# Patient Record
Sex: Female | Born: 1975 | Race: White | Hispanic: No | Marital: Married | Smoking: Former smoker
Health system: Southern US, Community
[De-identification: ages and names within clinical notes are randomized; demographics above are authoritative.]

## PROBLEM LIST (undated history)

## (undated) DIAGNOSIS — F419 Anxiety disorder, unspecified: Secondary | ICD-10-CM

## (undated) DIAGNOSIS — R002 Palpitations: Secondary | ICD-10-CM

## (undated) DIAGNOSIS — G4730 Sleep apnea, unspecified: Secondary | ICD-10-CM

## (undated) DIAGNOSIS — F32A Depression, unspecified: Secondary | ICD-10-CM

## (undated) DIAGNOSIS — I10 Essential (primary) hypertension: Secondary | ICD-10-CM

## (undated) DIAGNOSIS — F329 Major depressive disorder, single episode, unspecified: Secondary | ICD-10-CM

## (undated) DIAGNOSIS — M1990 Unspecified osteoarthritis, unspecified site: Secondary | ICD-10-CM

## (undated) DIAGNOSIS — E785 Hyperlipidemia, unspecified: Secondary | ICD-10-CM

## (undated) DIAGNOSIS — J189 Pneumonia, unspecified organism: Secondary | ICD-10-CM

## (undated) DIAGNOSIS — R079 Chest pain, unspecified: Secondary | ICD-10-CM

## (undated) HISTORY — DX: Chest pain, unspecified: R07.9

## (undated) HISTORY — DX: Major depressive disorder, single episode, unspecified: F32.9

## (undated) HISTORY — PX: CHOLECYSTECTOMY: SHX55

## (undated) HISTORY — DX: Anxiety disorder, unspecified: F41.9

## (undated) HISTORY — DX: Hyperlipidemia, unspecified: E78.5

## (undated) HISTORY — DX: Depression, unspecified: F32.A

## (undated) HISTORY — PX: OTHER SURGICAL HISTORY: SHX169

## (undated) HISTORY — DX: Unspecified osteoarthritis, unspecified site: M19.90

## (undated) HISTORY — DX: Sleep apnea, unspecified: G47.30

---

## 1999-04-18 ENCOUNTER — Emergency Department (HOSPITAL_COMMUNITY): Admission: EM | Admit: 1999-04-18 | Discharge: 1999-04-18 | Payer: Self-pay | Admitting: Emergency Medicine

## 1999-08-12 ENCOUNTER — Emergency Department (HOSPITAL_COMMUNITY): Admission: EM | Admit: 1999-08-12 | Discharge: 1999-08-13 | Payer: Self-pay | Admitting: Emergency Medicine

## 2000-08-21 ENCOUNTER — Inpatient Hospital Stay (HOSPITAL_COMMUNITY): Admission: AD | Admit: 2000-08-21 | Discharge: 2000-08-21 | Payer: Self-pay | Admitting: Obstetrics

## 2002-10-29 ENCOUNTER — Emergency Department (HOSPITAL_COMMUNITY): Admission: EM | Admit: 2002-10-29 | Discharge: 2002-10-29 | Payer: Self-pay | Admitting: *Deleted

## 2006-06-28 ENCOUNTER — Ambulatory Visit (HOSPITAL_COMMUNITY): Admission: RE | Admit: 2006-06-28 | Discharge: 2006-06-28 | Payer: Self-pay | Admitting: Obstetrics and Gynecology

## 2011-03-07 ENCOUNTER — Emergency Department (HOSPITAL_COMMUNITY)
Admission: EM | Admit: 2011-03-07 | Discharge: 2011-03-07 | Disposition: A | Discharge status: Home / Self Care | Payer: No Typology Code available for payment source | Attending: General Surgery | Admitting: General Surgery

## 2011-03-07 DIAGNOSIS — R55 Syncope and collapse: Secondary | ICD-10-CM | POA: Insufficient documentation

## 2011-03-07 DIAGNOSIS — S20219A Contusion of unspecified front wall of thorax, initial encounter: Secondary | ICD-10-CM | POA: Insufficient documentation

## 2011-03-07 LAB — POCT I-STAT, CHEM 8
Calcium, Ion: 1.22 mmol/L (ref 1.12–1.32)
Creatinine, Ser: 0.5 mg/dL (ref 0.4–1.2)
Glucose, Bld: 109 mg/dL — ABNORMAL HIGH (ref 70–99)
HCT: 37 % (ref 36.0–46.0)
Hemoglobin: 12.6 g/dL (ref 12.0–15.0)
Potassium: 3.3 meq/L — ABNORMAL LOW (ref 3.5–5.1)
TCO2: 28 mmol/L (ref 0–100)

## 2013-02-20 ENCOUNTER — Emergency Department (HOSPITAL_COMMUNITY)
Admission: EM | Admit: 2013-02-20 | Discharge: 2013-02-20 | Disposition: A | Discharge status: Home / Self Care | Payer: BC Managed Care – PPO | Attending: Emergency Medicine | Admitting: Emergency Medicine

## 2013-02-20 ENCOUNTER — Emergency Department (HOSPITAL_COMMUNITY): Payer: BC Managed Care – PPO

## 2013-02-20 ENCOUNTER — Encounter (HOSPITAL_COMMUNITY): Payer: Self-pay | Admitting: *Deleted

## 2013-02-20 DIAGNOSIS — Z3202 Encounter for pregnancy test, result negative: Secondary | ICD-10-CM | POA: Insufficient documentation

## 2013-02-20 DIAGNOSIS — R112 Nausea with vomiting, unspecified: Secondary | ICD-10-CM | POA: Insufficient documentation

## 2013-02-20 DIAGNOSIS — I10 Essential (primary) hypertension: Secondary | ICD-10-CM | POA: Insufficient documentation

## 2013-02-20 DIAGNOSIS — N200 Calculus of kidney: Secondary | ICD-10-CM

## 2013-02-20 DIAGNOSIS — Z8679 Personal history of other diseases of the circulatory system: Secondary | ICD-10-CM | POA: Insufficient documentation

## 2013-02-20 HISTORY — DX: Essential (primary) hypertension: I10

## 2013-02-20 HISTORY — DX: Palpitations: R00.2

## 2013-02-20 LAB — URINALYSIS, ROUTINE W REFLEX MICROSCOPIC
Bilirubin Urine: NEGATIVE
Nitrite: NEGATIVE
Protein, ur: 30 mg/dL — AB
Specific Gravity, Urine: 1.029 (ref 1.005–1.030)
Urobilinogen, UA: 0.2 mg/dL (ref 0.0–1.0)

## 2013-02-20 LAB — BASIC METABOLIC PANEL
Calcium: 9.8 mg/dL (ref 8.4–10.5)
GFR calc Af Amer: >90 mL/min (ref >90)
GFR calc non Af Amer: >90 mL/min (ref >90)
Glucose, Bld: 100 mg/dL — ABNORMAL HIGH (ref 70–99)
Potassium: 3.6 mEq/L (ref 3.5–5.1)
Sodium: 139 mEq/L (ref 135–145)

## 2013-02-20 LAB — CBC WITH DIFFERENTIAL/PLATELET
Basophils Relative: 1 % (ref 0–1)
Eosinophils Absolute: 0.1 10*3/uL (ref 0.0–0.7)
Eosinophils Relative: 1 % (ref 0–5)
Lymphs Abs: 2.3 10*3/uL (ref 0.7–4.0)
MCH: 28.8 pg (ref 26.0–34.0)
MCHC: 35.1 g/dL (ref 30.0–36.0)
MCV: 81.9 fL (ref 78.0–100.0)
Neutrophils Relative %: 62 % (ref 43–77)
Platelets: 346 10*3/uL (ref 150–400)
RDW: 11.9 % (ref 11.5–15.5)

## 2013-02-20 LAB — URINE MICROSCOPIC-ADD ON

## 2013-02-20 LAB — POCT PREGNANCY, URINE: Preg Test, Ur: NEGATIVE

## 2013-02-20 MED ORDER — IBUPROFEN 800 MG PO TABS: 800.0000 mg | ORAL_TABLET | Freq: Three times a day (TID) | ORAL | Status: DC | PRN

## 2013-02-20 MED ORDER — OXYCODONE-ACETAMINOPHEN 5-325 MG PO TABS: 1.0000 | ORAL_TABLET | ORAL | Status: DC | PRN

## 2013-02-20 MED ORDER — KETOROLAC TROMETHAMINE 30 MG/ML IJ SOLN
30.0000 mg | Freq: Once | INTRAMUSCULAR | Status: AC
Start: 2013-02-20 — End: 2013-02-20
  Administered 2013-02-20: 30 mg via INTRAVENOUS
  Filled 2013-02-20: qty 1

## 2013-02-20 MED ORDER — HYDROMORPHONE HCL PF 1 MG/ML IJ SOLN
1.0000 mg | Freq: Once | INTRAMUSCULAR | Status: AC
  Administered 2013-02-20: 1 mg via INTRAVENOUS
  Filled 2013-02-20: qty 1

## 2013-02-20 MED ORDER — PROMETHAZINE HCL 25 MG/ML IJ SOLN
25.0000 mg | Freq: Once | INTRAMUSCULAR | Status: AC
  Administered 2013-02-20: 25 mg via INTRAVENOUS
  Filled 2013-02-20: qty 1

## 2013-02-20 MED ORDER — TAMSULOSIN HCL 0.4 MG PO CAPS
0.4000 mg | ORAL_CAPSULE | Freq: Once | ORAL | Status: AC
  Administered 2013-02-20: 0.4 mg via ORAL
  Filled 2013-02-20: qty 1

## 2013-02-20 MED ORDER — TAMSULOSIN HCL 0.4 MG PO CAPS: 0.4000 mg | ORAL_CAPSULE | Freq: Once | ORAL | Status: DC

## 2013-02-20 MED ORDER — MORPHINE SULFATE 4 MG/ML IJ SOLN
4.0000 mg | Freq: Once | INTRAMUSCULAR | Status: AC
  Administered 2013-02-20: 4 mg via INTRAVENOUS
  Filled 2013-02-20: qty 1

## 2013-02-20 MED ORDER — ONDANSETRON HCL 4 MG/2ML IJ SOLN
4.0000 mg | Freq: Once | INTRAMUSCULAR | Status: AC
  Administered 2013-02-20: 4 mg via INTRAVENOUS
  Filled 2013-02-20: qty 2

## 2013-02-20 MED ORDER — MORPHINE SULFATE 2 MG/ML IJ SOLN
1.0000 mg | Freq: Once | INTRAMUSCULAR | Status: AC
  Administered 2013-02-20: 1 mg via INTRAVENOUS
  Filled 2013-02-20: qty 1

## 2013-02-20 MED ORDER — ONDANSETRON HCL 4 MG PO TABS: 4.0000 mg | ORAL_TABLET | Freq: Three times a day (TID) | ORAL | Status: DC | PRN

## 2013-02-20 NOTE — ED Notes (Signed)
Pt from home with reports of left flank pain, nausea and emesis that started this morning at 0630.

## 2013-02-20 NOTE — ED Provider Notes (Signed)
I saw and evaluated the patient, reviewed the resident's note and I agree with the findings and plan.  Improvement in symptoms after pain control.  Left-sided ureteral stone.  Urology followup  Lyanne Co, MD 02/20/13 [phone]

## 2013-02-20 NOTE — ED Provider Notes (Signed)
History     CSN: [id number]  Arrival date & time 02/20/13  4696   None     Chief Complaint  Patient presents with  . Flank Pain    Left  . Nausea  . Emesis    (Consider location/radiation/quality/duration/timing/severity/associated sxs/prior treatment) HPI 37 yo F with HTN and palpitations who presents with left flank pain.  She states it has been present off and on for about 3 weeks.  This morning it got much worse, associated with nausea and vomiting.  She denies any fevers, dysuria or hematuria.  No history of kidney stones.  She has tried ibuprofen for the pain with no improvement.  She states holding her side helps a little.  Has also had some vaginal pain since this morning.  Denies any abdominal pain or diarrhea.  Eating and drinking well at home.  Past Medical History  Diagnosis Date  . Hypertension   . Heart palpitations     Past Surgical History  Procedure Laterality Date  . C-sections      3  . Cholecystectomy      History reviewed. No pertinent family history.  History  Substance Use Topics  . Smoking status: Former Games developer  . Smokeless tobacco: Never Used  . Alcohol Use: 0.6 oz/week    1 Glasses of wine per week     Comment: daily    OB History   Grav Para Term Preterm Abortions TAB SAB Ect Mult Living                  Review of Systems  Constitutional: Negative for fever and chills.  HENT: Negative.   Eyes: Negative.   Respiratory: Negative.   Cardiovascular: Negative.   Gastrointestinal: Positive for nausea and vomiting. Negative for abdominal pain, diarrhea, constipation and blood in stool.  Genitourinary: Positive for flank pain and vaginal pain. Negative for dysuria and hematuria.  Musculoskeletal: Negative.   Skin: Negative.   Neurological: Negative.   All other systems reviewed and are negative.    Allergies  Codeine  Home Medications  No current outpatient prescriptions on file.  Wt 240 lb (108.863 kg)  LMP  01/20/2013  Physical Exam  Constitutional: She is oriented to person, place, and time. She appears well-developed and well-nourished. She appears distressed (tearful and hunched over).  HENT:  Head: Normocephalic and atraumatic.  Right Ear: External ear normal.  Left Ear: External ear normal.  Mouth/Throat: Oropharynx is clear and moist.  Eyes: Conjunctivae are normal. Right eye exhibits no discharge. Left eye exhibits no discharge.  Neck: Normal range of motion. Neck supple.  Cardiovascular: Normal rate, regular rhythm, normal heart sounds and intact distal pulses.   No murmur heard. Pulmonary/Chest: Effort normal and breath sounds normal. No respiratory distress. She has no wheezes. She has no rales.  Abdominal: Soft. Bowel sounds are normal. She exhibits no distension. There is tenderness (along right side). There is no rebound, no guarding and no CVA tenderness.  Musculoskeletal: She exhibits no edema and no tenderness.  Lymphadenopathy:    She has no cervical adenopathy.  Neurological: She is alert and oriented to person, place, and time. She exhibits normal muscle tone. Coordination normal.  Skin: Skin is warm and dry. No rash noted. She is not diaphoretic. No erythema.  Psychiatric: She has a normal mood and affect. Thought content normal.    ED Course  Procedures (including critical care time)  Labs Reviewed  BASIC METABOLIC PANEL - Abnormal; Notable for the following:  Glucose, Bld 100 (*)    All other components within normal limits  URINALYSIS, ROUTINE W REFLEX MICROSCOPIC - Abnormal; Notable for the following:    Color, Urine AMBER (*)    APPearance CLOUDY (*)    Hgb urine dipstick LARGE (*)    Protein, ur 30 (*)    Leukocytes, UA TRACE (*)    All other components within normal limits  URINE MICROSCOPIC-ADD ON - Abnormal; Notable for the following:    Squamous Epithelial / LPF FEW (*)    Bacteria, UA MANY (*)    All other components within normal limits  CBC  WITH DIFFERENTIAL  POCT PREGNANCY, URINE   Ct Abdomen Pelvis Wo Contrast  02/20/2013  *RADIOLOGY REPORT*  Clinical Data: Left flank pain.  Nausea and vomiting.  CT ABDOMEN AND PELVIS WITHOUT CONTRAST  Technique:  Multidetector CT imaging of the abdomen and pelvis was performed following the standard protocol without intravenous contrast.  Comparison: None.  Findings: Mild dependent atelectasis is seen in the lung bases.  No pleural or pericardial effusion.  There is mild to moderate left hydronephrosis with stranding about the left kidney and ureter due to a 0.4 cm stone at the left UVJ. Also seen is a punctate nonobstructing stone in the upper pole of the left kidney.  The right kidney appears normal.  No urinary tract stones are seen on the right.  The patient is status post cholecystectomy.  The liver, spleen, adrenal glands and pancreas appear normal.  The stomach, small and large bowel and appendix appear normal.  Uterus and adnexa are unremarkable.  Small amount of free pelvic fluid is noted.  There is no lymphadenopathy.  No focal bony abnormality.  IMPRESSION:  1.  Moderate left hydronephrosis due to a 0.4 cm stone at the left UVJ.  Punctate nonobstructing stone upper pole left kidney also noted. 2.  Status post cholecystectomy.   Original Report Authenticated By: Holley Dexter, M.D.      No diagnosis found.    MDM  37 yo F with HTN presenting with intermittent left flank pain x3 weeks that became worse and constant this morning.  History is suspicious for kidney stone that is now causing obstruction.  Differential also includes UTI/pyelonephritis, ovarian pathology, diverticulitis.  However, these seem less likely given the location of her pain and lack of other symptoms.  Will given morphine IV for pain with zofran, check cbc, bmp, ua, upreg.   10:00AM: Labs are within normal limits.  Awaiting urine.  Pain improved some with 4 of morphine.  Will get CT scan to evaluate for obstructing  stone.  11:00AM: CT shows obstructing 4mm stone at the UV junction.  Will give toradol.  Discussed with patient that stone will likely pass on its own.  Will continue to work on pain control.  UA not particularly concerning for infection, will send for culture.  Plan on discharge with follow up with urology.  12:20PM: Discussed diagnosis and treatment with husband.  Patient is sleeping comfortably.  Home with percocet 1-2 tables q4hrs, zofran q8hr, flomax daily.  Follow up with urology next week.  Return precautions reviewed including worsening pain, unable to tolerate POs, and fever.  Susan Colla, MD 02/20/13 [phone]

## 2013-02-20 NOTE — ED Notes (Signed)
Resident A. Elwyn Reach notified pt is asking for more pain medication.

## 2013-09-02 ENCOUNTER — Emergency Department (HOSPITAL_BASED_OUTPATIENT_CLINIC_OR_DEPARTMENT_OTHER)
Admission: EM | Admit: 2013-09-02 | Discharge: 2013-09-02 | Disposition: A | Discharge status: Home / Self Care | Payer: BC Managed Care – PPO | Attending: Emergency Medicine | Admitting: Emergency Medicine

## 2013-09-02 ENCOUNTER — Encounter (HOSPITAL_BASED_OUTPATIENT_CLINIC_OR_DEPARTMENT_OTHER): Payer: Self-pay | Admitting: Emergency Medicine

## 2013-09-02 ENCOUNTER — Emergency Department (HOSPITAL_BASED_OUTPATIENT_CLINIC_OR_DEPARTMENT_OTHER): Payer: BC Managed Care – PPO

## 2013-09-02 DIAGNOSIS — F17200 Nicotine dependence, unspecified, uncomplicated: Secondary | ICD-10-CM | POA: Insufficient documentation

## 2013-09-02 DIAGNOSIS — S8990XA Unspecified injury of unspecified lower leg, initial encounter: Secondary | ICD-10-CM | POA: Insufficient documentation

## 2013-09-02 DIAGNOSIS — M25561 Pain in right knee: Secondary | ICD-10-CM

## 2013-09-02 DIAGNOSIS — I10 Essential (primary) hypertension: Secondary | ICD-10-CM | POA: Insufficient documentation

## 2013-09-02 DIAGNOSIS — X500XXA Overexertion from strenuous movement or load, initial encounter: Secondary | ICD-10-CM | POA: Insufficient documentation

## 2013-09-02 DIAGNOSIS — Y9389 Activity, other specified: Secondary | ICD-10-CM | POA: Insufficient documentation

## 2013-09-02 DIAGNOSIS — W07XXXA Fall from chair, initial encounter: Secondary | ICD-10-CM | POA: Insufficient documentation

## 2013-09-02 DIAGNOSIS — Y929 Unspecified place or not applicable: Secondary | ICD-10-CM | POA: Insufficient documentation

## 2013-09-02 MED ORDER — HYDROCODONE-ACETAMINOPHEN 5-325 MG PO TABS: 1.0000 | ORAL_TABLET | Freq: Three times a day (TID) | ORAL | Status: DC | PRN

## 2013-09-02 NOTE — ED Notes (Signed)
MD at bedside. 

## 2013-09-02 NOTE — ED Provider Notes (Signed)
CSN: [id number]     Arrival date & time 09/02/13  1805 History   First MD Initiated Contact with Patient 09/02/13 1811     This chart was scribed for Gerhard Munch, MD by Arlan Organ, ED Scribe. This patient was seen in room MH02/MH02 and the patient's care was started 6:21 PM.   Chief Complaint  Patient presents with  . Knee Injury   The history is provided by the patient. No language interpreter was used.   HPI Comments: Susan Barrett is a 37 y.o. female who presents to the Emergency Department complaining of sudden onset, gradually worsening, constant right knee pain that started 3 days ago. She stated she was hanging up some decorations when she fell off of a chair, and twisted her knee. She states she heard a "crack" right after impact. She says she has been taking ibuprofen for pain with mild relief. Pt denies fever, chills, or dyspnea. Pt states she occasionally smokes. She states she is otherwise healthy.  Past Medical History  Diagnosis Date  . Hypertension   . Heart palpitations    Past Surgical History  Procedure Laterality Date  . C-sections      3  . Cholecystectomy     History reviewed. No pertinent family history. History  Substance Use Topics  . Smoking status: Current Some Day Smoker  . Smokeless tobacco: Never Used  . Alcohol Use: 0.6 oz/week    1 Glasses of wine per week     Comment: daily   OB History   Grav Para Term Preterm Abortions TAB SAB Ect Mult Living                 Review of Systems  Constitutional:       Per HPI, otherwise negative  HENT:       Per HPI, otherwise negative  Respiratory:       Per HPI, otherwise negative  Cardiovascular:       Per HPI, otherwise negative  Gastrointestinal: Negative for vomiting.  Endocrine:       Negative aside from HPI  Genitourinary:       Neg aside from HPI   Musculoskeletal:       Per HPI, otherwise negative  Skin: Negative.   Neurological: Negative for syncope.    Allergies   Codeine  Home Medications   Current Outpatient Rx  Name  Route  Sig  Dispense  Refill  . ibuprofen (ADVIL,MOTRIN) 800 MG tablet   Oral   Take 1 tablet (800 mg total) by mouth every 8 (eight) hours as needed for pain.   30 tablet   0   . ondansetron (ZOFRAN) 4 MG tablet   Oral   Take 1 tablet (4 mg total) by mouth every 8 (eight) hours as needed for nausea.   20 tablet   0   . oxyCODONE-acetaminophen (ROXICET) 5-325 MG per tablet   Oral   Take 1-2 tablets by mouth every 4 (four) hours as needed for pain.   30 tablet   0   . tamsulosin (FLOMAX) 0.4 MG CAPS   Oral   Take 1 capsule (0.4 mg total) by mouth once.   10 capsule   0    BP 121/81  Pulse 62  Temp(Src) 98.2 F (36.8 C)  Resp 16  Ht 5\' 8"  (1.727 m)  Wt 240 lb (108.863 kg)  BMI 36.50 kg/m2  SpO2 99%  LMP 09/02/2013  Physical Exam  Nursing note and vitals reviewed.  Constitutional: She is oriented to person, place, and time. She appears well-developed and well-nourished. No distress.  HENT:  Head: Normocephalic and atraumatic.  Eyes: Conjunctivae and EOM are normal.  Cardiovascular: Normal rate and regular rhythm.   Pulmonary/Chest: Effort normal and breath sounds normal. No stridor. No respiratory distress.  Abdominal: She exhibits no distension.  Musculoskeletal: She exhibits no edema.  Diffused tenderness throughout the knee No patella tenderness Thigh pain Knee is stable Medial quad pain   Neurological: She is alert and oriented to person, place, and time. No cranial nerve deficit.  Skin: Skin is warm and dry.  Psychiatric: She has a normal mood and affect.    ED Course  Procedures (including critical care time)  DIAGNOSTIC STUDIES: Oxygen Saturation is 99% on RA, Normal by my interpretation.    COORDINATION OF CARE: 6:21 PM- Will order X-Rays. Discussed treatment plan with pt at bedside and pt agreed to plan.      Labs Review Labs Reviewed - No data to display Imaging Review No  results found.  EKG Interpretation   None       MDM  No diagnosis found. I personally performed the services described in this documentation, which was scribed in my presence. The recorded information has been reviewed and is accurate.  Patient presents several hours after minor trauma with ongoing pain in the right knee.  She is neurovascularly intact, with preserved capacity to flex and extend the knee.  She can bear weight.  There is no radiographic evidence of fracture.  The patient was discharged in stable condition with Ace wrap, crutches, orthopedics followup   Gerhard Munch, MD 09/02/13 1931

## 2013-09-02 NOTE — ED Notes (Signed)
Pt d/c home with rx x 1 for hydrocodone. Ice pack and crutches given for home use

## 2013-09-02 NOTE — ED Notes (Signed)
Pt c/o right knee injury x 3 days ago  

## 2014-12-30 ENCOUNTER — Emergency Department (HOSPITAL_COMMUNITY)
Admission: EM | Admit: 2014-12-30 | Discharge: 2014-12-30 | Disposition: A | Discharge status: Home / Self Care | Payer: BLUE CROSS/BLUE SHIELD | Source: Home / Self Care

## 2014-12-30 ENCOUNTER — Encounter (HOSPITAL_COMMUNITY): Payer: Self-pay | Admitting: *Deleted

## 2014-12-30 DIAGNOSIS — S61219A Laceration without foreign body of unspecified finger without damage to nail, initial encounter: Secondary | ICD-10-CM

## 2014-12-30 MED ORDER — POVIDONE-IODINE 10 % EX SOLN
CUTANEOUS | Status: AC
  Filled 2014-12-30: qty 118

## 2014-12-30 NOTE — ED Notes (Addendum)
Laceration      To       l middle  Finger          This am  When  She  Snagged  It  On   Some  Wood         - bleeding  Has  Subsided        Incident  Happened  This  Am   Tet  Shot is  utd

## 2014-12-30 NOTE — ED Provider Notes (Signed)
CSN: [id number]     Arrival date & time 12/30/14  0809 History   None    Chief Complaint  Patient presents with  . Laceration   (Consider location/radiation/quality/duration/timing/severity/associated sxs/prior Treatment) Patient is a 39 y.o. female presenting with skin laceration. The history is provided by the patient.  Laceration Location:  Finger Finger laceration location:  L middle finger Depth:  Cutaneous Quality: straight   Bleeding: controlled   Laceration mechanism:  Metal edge Pain details:    Progression:  Unchanged Foreign body present:  No foreign bodies Ineffective treatments:  None tried Tetanus status:  Up to date   Past Medical History  Diagnosis Date  . Hypertension   . Heart palpitations    Past Surgical History  Procedure Laterality Date  . C-sections      3  . Cholecystectomy     History reviewed. No pertinent family history. History  Substance Use Topics  . Smoking status: Current Some Day Smoker  . Smokeless tobacco: Never Used  . Alcohol Use: 0.6 oz/week    1 Glasses of wine per week     Comment: daily   OB History    No data available     Review of Systems  Constitutional: Negative.   Skin: Positive for wound.    Allergies  Codeine  Home Medications   Prior to Admission medications   Medication Sig Start Date End Date Taking? Authorizing Provider  Sertraline HCl (ZOLOFT PO) Take by mouth.   Yes Historical Provider, MD  HYDROcodone-acetaminophen (NORCO/VICODIN) 5-325 MG per tablet Take 1 tablet by mouth every 8 (eight) hours as needed for pain. 09/02/13   Gerhard Munch, MD  ibuprofen (ADVIL,MOTRIN) 800 MG tablet Take 1 tablet (800 mg total) by mouth every 8 (eight) hours as needed for pain. 02/20/13   Charm Rings, MD  ondansetron (ZOFRAN) 4 MG tablet Take 1 tablet (4 mg total) by mouth every 8 (eight) hours as needed for nausea. 02/20/13   Charm Rings, MD  oxyCODONE-acetaminophen (ROXICET) 5-325 MG per tablet Take 1-2 tablets by  mouth every 4 (four) hours as needed for pain. 02/20/13   Charm Rings, MD  tamsulosin (FLOMAX) 0.4 MG CAPS Take 1 capsule (0.4 mg total) by mouth once. 02/20/13   Charm Rings, MD   BP 120/80 mmHg  Pulse 72  Temp(Src) 98.6 F (37 C) (Oral)  Resp 18  SpO2 100%  LMP 12/02/2014 Physical Exam  Constitutional: She is oriented to person, place, and time. She appears well-developed and well-nourished.  Musculoskeletal: She exhibits tenderness.  Neurological: She is alert and oriented to person, place, and time.  Skin: Skin is warm and dry.  Lac to volar pip jt of lmf, nvt intact.  Nursing note and vitals reviewed.   ED Course  LACERATION REPAIR Date/Time: 12/30/2014 9:14 AM Performed by: Linna Hoff Authorized by: Bradd Canary D Consent: Verbal consent obtained. Consent given by: patient Body area: upper extremity Location details: left long finger Laceration length: 2 cm Foreign bodies: no foreign bodies Tendon involvement: none Nerve involvement: none Vascular damage: no Anesthesia: local infiltration Local anesthetic: lidocaine 2% without epinephrine Patient sedated: no Preparation: Patient was prepped and draped in the usual sterile fashion. Irrigation solution: saline Irrigation method: syringe Amount of cleaning: standard Debridement: none Degree of undermining: none Skin closure: 5-0 Prolene Number of sutures: 4 Technique: simple Approximation: close Approximation difficulty: simple Dressing: antibiotic ointment and gauze roll Patient tolerance: Patient tolerated the procedure well with no  immediate complications   (including critical care time) Labs Review Labs Reviewed - No data to display  Imaging Review No results found.   MDM   1. Finger laceration, initial encounter        Linna HoffJames D Gizzelle Lacomb, MD 12/30/14 [phone]

## 2017-03-03 ENCOUNTER — Encounter: Payer: Self-pay | Admitting: Gastroenterology

## 2017-04-11 ENCOUNTER — Other Ambulatory Visit (INDEPENDENT_AMBULATORY_CARE_PROVIDER_SITE_OTHER): Payer: BLUE CROSS/BLUE SHIELD

## 2017-04-11 ENCOUNTER — Encounter (INDEPENDENT_AMBULATORY_CARE_PROVIDER_SITE_OTHER): Payer: Self-pay

## 2017-04-11 ENCOUNTER — Ambulatory Visit (INDEPENDENT_AMBULATORY_CARE_PROVIDER_SITE_OTHER): Payer: BLUE CROSS/BLUE SHIELD | Admitting: Gastroenterology

## 2017-04-11 ENCOUNTER — Encounter: Payer: Self-pay | Admitting: Gastroenterology

## 2017-04-11 VITALS — BP 100/70 | HR 60 | Ht 68.0 in | Wt 242.0 lb

## 2017-04-11 DIAGNOSIS — Z800 Family history of malignant neoplasm of digestive organs: Secondary | ICD-10-CM

## 2017-04-11 DIAGNOSIS — R635 Abnormal weight gain: Secondary | ICD-10-CM

## 2017-04-11 DIAGNOSIS — R5383 Other fatigue: Secondary | ICD-10-CM | POA: Diagnosis not present

## 2017-04-11 LAB — CBC WITH DIFFERENTIAL/PLATELET
Basophils Absolute: 0.1 10*3/uL (ref 0.0–0.1)
Basophils Relative: 1 % (ref 0.0–3.0)
EOS PCT: 1.9 % (ref 0.0–5.0)
Eosinophils Absolute: 0.1 10*3/uL (ref 0.0–0.7)
HCT: 40 % (ref 36.0–46.0)
Hemoglobin: 13.8 g/dL (ref 12.0–15.0)
Lymphocytes Relative: 36.9 % (ref 12.0–46.0)
Lymphs Abs: 3 10*3/uL (ref 0.7–4.0)
MCHC: 34.4 g/dL (ref 30.0–36.0)
MCV: 84.2 fl (ref 78.0–100.0)
Monocytes Absolute: 0.7 10*3/uL (ref 0.1–1.0)
Monocytes Relative: 8.8 % (ref 3.0–12.0)
NEUTROS PCT: 51.4 % (ref 43.0–77.0)
Neutro Abs: 4.2 10*3/uL (ref 1.4–7.7)
Platelets: 356 10*3/uL (ref 150.0–400.0)
RBC: 4.75 Mil/uL (ref 3.87–5.11)
RDW: 12.7 % (ref 11.5–15.5)
WBC: 8.1 10*3/uL (ref 4.0–10.5)

## 2017-04-11 LAB — BASIC METABOLIC PANEL
BUN: 12 mg/dL (ref 6–23)
CALCIUM: 9.6 mg/dL (ref 8.4–10.5)
CO2: 32 meq/L (ref 19–32)
Chloride: 102 mEq/L (ref 96–112)
Creatinine, Ser: 0.57 mg/dL (ref 0.40–1.20)
GFR: 124.07 mL/min (ref >60.00)
Glucose, Bld: 91 mg/dL (ref 70–99)
Potassium: 4.3 mEq/L (ref 3.5–5.1)
SODIUM: 138 meq/L (ref 135–145)

## 2017-04-11 LAB — TSH: TSH: 0.93 u[IU]/mL (ref 0.35–4.50)

## 2017-04-11 MED ORDER — NA SULFATE-K SULFATE-MG SULF 17.5-3.13-1.6 GM/177ML PO SOLN: 1.0000 | Freq: Once | ORAL | 0 refills | Status: AC

## 2017-04-11 NOTE — Progress Notes (Signed)
PIYA MESCH    [id number]    08/16/1976  Primary Care Physician:Patient, No Pcp Per  Referring Physician: No referring provider defined for this encounter.  Chief complaint:  Family history of colon cancer and multiple colon polyps   HPI:  41 year old female here to discuss colorectal cancer screening. She has family history of colon cancer on her paternal side, both her paternal aunt and uncle diagnosed with colon cancer in their early 107s. Her father had around 65 polyps removed on his first screening colonoscopy, she thinks one of the polyps was advanced with foci of cancer. He has been getting frequent colonoscopies since then. Her sister had 8 polyps removed on her colonoscopy done at age 35. Her brother had colonoscopy at age 77 and had 16 polyps removed. Her other brother colonoscopy at age 103 with 6 polyps that were removed. Records are not available during this visit to review. Patient denies any abdominal pain, nausea, vomiting, change in bowel habits or blood per rectum. She complains of excessive weight gain, she has gained greater than 60 pounds in the past 1 year. Also complains of fatigue and increased lethargy.   Outpatient Encounter Prescriptions as of 04/11/2017  Medication Sig  . ibuprofen (ADVIL,MOTRIN) 800 MG tablet Take 1 tablet (800 mg total) by mouth every 8 (eight) hours as needed for pain.  . Sertraline HCl (ZOLOFT PO) Take by mouth.  . [DISCONTINUED] HYDROcodone-acetaminophen (NORCO/VICODIN) 5-325 MG per tablet Take 1 tablet by mouth every 8 (eight) hours as needed for pain.  . [DISCONTINUED] ondansetron (ZOFRAN) 4 MG tablet Take 1 tablet (4 mg total) by mouth every 8 (eight) hours as needed for nausea.  . [DISCONTINUED] oxyCODONE-acetaminophen (ROXICET) 5-325 MG per tablet Take 1-2 tablets by mouth every 4 (four) hours as needed for pain.  . [DISCONTINUED] tamsulosin (FLOMAX) 0.4 MG CAPS Take 1 capsule (0.4 mg total) by mouth once.   No  facility-administered encounter medications on file as of 04/11/2017.     Allergies as of 04/11/2017 - Review Complete 04/11/2017  Allergen Reaction Noted  . Codeine Hives and Itching 02/20/2013  . Hydrocodone  10/11/2011    Past Medical History:  Diagnosis Date  . Heart palpitations   . Hypertension     Past Surgical History:  Procedure Laterality Date  . C-sections     3  . CHOLECYSTECTOMY      No family history on file.  Social History   Social History  . Marital status: Single    Spouse name: N/A  . Number of children: 3  . Years of education: N/A   Occupational History  . Housekeeping    Social History Main Topics  . Smoking status: Former Games developer  . Smokeless tobacco: Never Used  . Alcohol use 0.6 oz/week    1 Glasses of wine per week     Comment: daily  . Drug use: No  . Sexual activity: Not on file   Other Topics Concern  . Not on file   Social History Narrative  . No narrative on file      Review of systems: Review of Systems  Constitutional: Negative for fever and chills.  positive for fatigue HENT: Negative.   Eyes: Negative for blurred vision.  Respiratory: Negative for cough, shortness of breath and wheezing.   Cardiovascular: Negative for chest pain and palpitations.  Gastrointestinal: as per HPI Genitourinary: Negative for dysuria, urgency, frequency and hematuria.  Musculoskeletal: Positive for  myalgias, back pain and joint pain.  Skin: Negative for itching and rash.  Neurological: Negative for dizziness, tremors, focal weakness, seizures and loss of consciousness.  positive for headaches Endo/Heme/Allergies: Positive for seasonal allergies.  Psychiatric/Behavioral: Negative for  suicidal ideas and hallucinations.  positive for depression and anxiety All other systems reviewed and are negative.   Physical Exam: Vitals:   04/11/17 1054  BP: 100/70  Pulse: 60   Body mass index is 36.8 kg/m. Gen:      No acute distress HEENT:   EOMI, sclera anicteric Neck:     No masses; no thyromegaly Lungs:    Clear to auscultation bilaterally; normal respiratory effort CV:         Regular rate and rhythm; no murmurs Abd:      + bowel sounds; soft, non-tender; no palpable masses, no distension Ext:    No edema; adequate peripheral perfusion Skin:      Warm and dry; no rash Neuro: alert and oriented x 3 Psych: normal mood and affect  Data Reviewed:  Reviewed labs, radiology imaging, old records and pertinent past GI work up   Assessment and Plan/Recommendations:  41 year old female with history of colon cancer in multiple second-degree relatives (Paternal side) and history of multiple colon polyps in her first-degree relatives (father and siblings) here to discuss colorectal cancer screening  We'll schedule for colonoscopy for colorectal cancer screening given her family history The risks and benefits as well as alternatives of endoscopic procedure(s) have been discussed and reviewed. All questions answered. The patient agrees to proceed.  I'll also refer to genetic counseling to evaluate for possible polyposis syndrome or Lynch syndrome  Excessive weight gain associated with fatigue and lethargy Check TSH, CBC, BMP Advise patient to follow up with PMD  Return after colonoscopy as needed  K. Scherry RanVeena Nandigam , MD [phone] Mon-Fri 8a-5p [phone] after 5p, weekends, holidays  CC: No ref. provider found

## 2017-04-11 NOTE — Patient Instructions (Addendum)
Go to the basement for labs today  You have been scheduled for a colonoscopy. Please follow written instructions given to you at your visit today.  Please pick up your prep supplies at the pharmacy within the next 1-3 days. If you use inhalers (even only as needed), please bring them with you on the day of your procedure.   We will refer you to Genetics counseling, they will contact you with that appointment   Follow up as needed

## 2017-04-17 ENCOUNTER — Encounter: Payer: Self-pay | Admitting: Gastroenterology

## 2017-05-01 ENCOUNTER — Encounter: Payer: BLUE CROSS/BLUE SHIELD | Admitting: Gastroenterology

## 2018-06-30 ENCOUNTER — Encounter (HOSPITAL_BASED_OUTPATIENT_CLINIC_OR_DEPARTMENT_OTHER): Payer: Self-pay | Admitting: Emergency Medicine

## 2018-06-30 ENCOUNTER — Other Ambulatory Visit: Payer: Self-pay

## 2018-06-30 ENCOUNTER — Emergency Department (HOSPITAL_BASED_OUTPATIENT_CLINIC_OR_DEPARTMENT_OTHER)
Admission: EM | Admit: 2018-06-30 | Discharge: 2018-07-01 | Disposition: A | Discharge status: Home / Self Care | Payer: BLUE CROSS/BLUE SHIELD | Attending: Emergency Medicine | Admitting: Emergency Medicine

## 2018-06-30 DIAGNOSIS — J029 Acute pharyngitis, unspecified: Secondary | ICD-10-CM | POA: Diagnosis not present

## 2018-06-30 DIAGNOSIS — Z87891 Personal history of nicotine dependence: Secondary | ICD-10-CM | POA: Diagnosis not present

## 2018-06-30 DIAGNOSIS — G4489 Other headache syndrome: Secondary | ICD-10-CM

## 2018-06-30 DIAGNOSIS — E785 Hyperlipidemia, unspecified: Secondary | ICD-10-CM | POA: Insufficient documentation

## 2018-06-30 DIAGNOSIS — Z79899 Other long term (current) drug therapy: Secondary | ICD-10-CM | POA: Insufficient documentation

## 2018-06-30 DIAGNOSIS — B349 Viral infection, unspecified: Secondary | ICD-10-CM

## 2018-06-30 DIAGNOSIS — R059 Cough, unspecified: Secondary | ICD-10-CM

## 2018-06-30 DIAGNOSIS — R05 Cough: Secondary | ICD-10-CM | POA: Diagnosis not present

## 2018-06-30 DIAGNOSIS — I10 Essential (primary) hypertension: Secondary | ICD-10-CM | POA: Diagnosis not present

## 2018-06-30 DIAGNOSIS — R51 Headache: Secondary | ICD-10-CM | POA: Diagnosis present

## 2018-06-30 NOTE — ED Triage Notes (Signed)
PT presents with c/o headache, runny nose and scratchy throat for 3 days

## 2018-07-01 LAB — GROUP A STREP BY PCR: GROUP A STREP BY PCR: NOT DETECTED

## 2018-07-01 MED ORDER — METOCLOPRAMIDE HCL 5 MG/ML IJ SOLN
10.0000 mg | Freq: Once | INTRAMUSCULAR | Status: AC
  Administered 2018-07-01: 10 mg via INTRAVENOUS
  Filled 2018-07-01: qty 2

## 2018-07-01 MED ORDER — KETOROLAC TROMETHAMINE 30 MG/ML IJ SOLN
30.0000 mg | Freq: Once | INTRAMUSCULAR | Status: AC
  Administered 2018-07-01: 30 mg via INTRAVENOUS
  Filled 2018-07-01: qty 1

## 2018-07-01 MED ORDER — DIPHENHYDRAMINE HCL 50 MG/ML IJ SOLN
25.0000 mg | Freq: Once | INTRAMUSCULAR | Status: AC
  Administered 2018-07-01: 25 mg via INTRAVENOUS
  Filled 2018-07-01: qty 1

## 2018-07-01 MED ORDER — SODIUM CHLORIDE 0.9 % IV BOLUS (SEPSIS)
1000.0000 mL | Freq: Once | INTRAVENOUS | Status: AC
  Administered 2018-07-01: 1000 mL via INTRAVENOUS

## 2018-07-01 NOTE — ED Notes (Signed)
Offered pt a wheelchair at d/c, she declined.

## 2018-07-01 NOTE — ED Provider Notes (Signed)
[facility] EMERGENCY DEPARTMENT Provider Note   CSN: [id number] Arrival date & time: 06/30/18  2322    History   Chief Complaint Chief Complaint  Patient presents with  . Headache    HPI Susan Barrett is a 42 y.o. female.  The history is provided by the patient.  Headache   This is a new problem. The current episode started more than 2 days ago. The problem occurs constantly. The problem has been gradually worsening. The pain is moderate. Pertinent negatives include no fever, no shortness of breath and no vomiting. She has tried nothing for the symptoms.  PT With history of anxiety presents with sore throat, cough, headache. No fever.  No vomiting.  She reports rhinorrhea and congestion.  No chest pain or shortness of breath. She has had sick contacts.  No recent travel.  She is a non-smoker. Past Medical History:  Diagnosis Date  . Anxiety and depression   . Arthritis   . Heart palpitations   . Hyperlipemia   . Hypertension   . Sleep apnea     There are no active problems to display for this patient.   Past Surgical History:  Procedure Laterality Date  . C-sections     3  . CHOLECYSTECTOMY       OB History   None      Home Medications    Prior to Admission medications   Medication Sig Start Date End Date Taking? Authorizing Provider  Sertraline HCl (ZOLOFT PO) Take by mouth.   Yes [provider]  ibuprofen (ADVIL,MOTRIN) 800 MG tablet Take 1 tablet (800 mg total) by mouth every 8 (eight) hours as needed for pain. 02/20/13   Charm Rings, MD    Family History Family History  Problem Relation Age of Onset  . Colon polyps Father   . Crohn's disease Father   . Heart disease Father   . Colon cancer Paternal Aunt   . Colon cancer Paternal Uncle   . Diabetes Son     Social History Social History   Tobacco Use  . Smoking status: Former Games developer  . Smokeless tobacco: Never Used  Substance Use Topics  . Alcohol use: Yes   Alcohol/week: 1.0 standard drinks    Types: 1 Glasses of wine per week    Comment: daily  . Drug use: No     Allergies   Codeine and Hydrocodone   Review of Systems Review of Systems  Constitutional: Negative for fever.  HENT: Positive for rhinorrhea and sore throat.   Respiratory: Negative for shortness of breath.   Cardiovascular: Negative for chest pain.  Gastrointestinal: Negative for vomiting.  Skin: Negative for rash.  Neurological: Positive for headaches. Negative for weakness.  All other systems reviewed and are negative.    Physical Exam Updated Vital Signs BP 111/65   Pulse 64   Temp 98.1 F (36.7 C) (Oral)   Resp (!) 21   SpO2 95%   Physical Exam CONSTITUTIONAL: Well developed/well nourished, anxious and moaning HEAD: Normocephalic/atraumatic EYES: EOMI/PERRL ENMT: Mucous membranes moist, uvula midline, erythema noted, no exudates, no stridor or drooling. NECK: supple no meningeal signs, left cervical lymphadenopathy SPINE/BACK:entire spine nontender CV: S1/S2 noted, no murmurs/rubs/gallops noted LUNGS: Lungs are clear to auscultation bilaterally, no apparent distress ABDOMEN: soft, nontender NEURO: Pt is awake/alert/appropriate, moves all extremitiesx4.  No facial droop.  No arm or leg drift.  Mental status appropriate EXTREMITIES: pulses normal/equal, full ROM SKIN: warm, color normal, no rash or  petechiae PSYCH: Anxious   ED Treatments / Results  Labs (all labs ordered are listed, but only abnormal results are displayed) Labs Reviewed  GROUP A STREP BY PCR    EKG None  Radiology No results found.  Procedures Procedures  Medications Ordered in ED Medications  metoCLOPramide (REGLAN) injection 10 mg (10 mg Intravenous Given 07/01/18 0214)  diphenhydrAMINE (BENADRYL) injection 25 mg (25 mg Intravenous Given 07/01/18 0213)  ketorolac (TORADOL) 30 MG/ML injection 30 mg (30 mg Intravenous Given 07/01/18 0214)  sodium chloride 0.9 % bolus 1,000 mL  (0 mLs Intravenous Stopped 07/01/18 0336)     Initial Impression / Assessment and Plan / ED Course  I have reviewed the triage vital signs and the nursing notes.  Pertinent labs  results that were available during my care of the patient were reviewed by me and considered in my medical decision making (see chart for details).     Presented with headache, sore throat, myalgias.  Suspect viral illness.  Strep test negative.  She had no focal neuro deficits. She was in no distress and appears improved. Low suspicion for meningitis Lung Sounds clear, doubt pneumonia. Discussed symptomatic treatment.  She is appropriate for discharge home  Final Clinical Impressions(s) / ED Diagnoses   Final diagnoses:  Other headache syndrome  Pharyngitis, unspecified etiology  Cough    ED Discharge Orders    None       Zadie Rhine, MD 07/01/18 [phone]

## 2018-07-01 NOTE — ED Notes (Signed)
MD with pt  

## 2019-01-08 ENCOUNTER — Other Ambulatory Visit: Payer: Self-pay

## 2019-01-08 ENCOUNTER — Encounter (HOSPITAL_COMMUNITY): Payer: Self-pay

## 2019-01-08 ENCOUNTER — Emergency Department (HOSPITAL_COMMUNITY): Payer: BLUE CROSS/BLUE SHIELD

## 2019-01-08 ENCOUNTER — Emergency Department (HOSPITAL_COMMUNITY)
Admission: EM | Admit: 2019-01-08 | Discharge: 2019-01-09 | Disposition: A | Discharge status: Home / Self Care | Payer: BLUE CROSS/BLUE SHIELD | Attending: Emergency Medicine | Admitting: Emergency Medicine

## 2019-01-08 DIAGNOSIS — Z79899 Other long term (current) drug therapy: Secondary | ICD-10-CM | POA: Diagnosis not present

## 2019-01-08 DIAGNOSIS — J189 Pneumonia, unspecified organism: Secondary | ICD-10-CM | POA: Insufficient documentation

## 2019-01-08 DIAGNOSIS — I10 Essential (primary) hypertension: Secondary | ICD-10-CM | POA: Insufficient documentation

## 2019-01-08 DIAGNOSIS — R05 Cough: Secondary | ICD-10-CM | POA: Diagnosis present

## 2019-01-08 DIAGNOSIS — Z87891 Personal history of nicotine dependence: Secondary | ICD-10-CM | POA: Diagnosis not present

## 2019-01-08 HISTORY — DX: Pneumonia, unspecified organism: J18.9

## 2019-01-08 LAB — BASIC METABOLIC PANEL
Anion gap: 11 (ref 5–15)
BUN: 12 mg/dL (ref 6–20)
CALCIUM: 9.1 mg/dL (ref 8.9–10.3)
CHLORIDE: 99 mmol/L (ref 98–111)
CO2: 24 mmol/L (ref 22–32)
Creatinine, Ser: 0.46 mg/dL (ref 0.44–1.00)
GFR calc non Af Amer: >60 mL/min (ref >60)
Glucose, Bld: 117 mg/dL — ABNORMAL HIGH (ref 70–99)
Potassium: 3.8 mmol/L (ref 3.5–5.1)
SODIUM: 134 mmol/L — AB (ref 135–145)

## 2019-01-08 LAB — CBC
HEMATOCRIT: 45.4 % (ref 36.0–46.0)
Hemoglobin: 14.9 g/dL (ref 12.0–15.0)
MCH: 28.1 pg (ref 26.0–34.0)
MCHC: 32.8 g/dL (ref 30.0–36.0)
MCV: 85.7 fL (ref 80.0–100.0)
Platelets: 300 10*3/uL (ref 150–400)
RBC: 5.3 MIL/uL — ABNORMAL HIGH (ref 3.87–5.11)
RDW: 11.9 % (ref 11.5–15.5)
WBC: 6.4 10*3/uL (ref 4.0–10.5)
nRBC: 0 % (ref 0.0–0.2)

## 2019-01-08 MED ORDER — LEVOFLOXACIN IN D5W 750 MG/150ML IV SOLN
750.0000 mg | Freq: Once | INTRAVENOUS | Status: AC
  Administered 2019-01-09: 750 mg via INTRAVENOUS
  Filled 2019-01-08: qty 150

## 2019-01-08 MED ORDER — IPRATROPIUM-ALBUTEROL 0.5-2.5 (3) MG/3ML IN SOLN
3.0000 mL | Freq: Once | RESPIRATORY_TRACT | Status: AC
  Administered 2019-01-09: 3 mL via RESPIRATORY_TRACT
  Filled 2019-01-08: qty 3

## 2019-01-08 MED ORDER — SODIUM CHLORIDE 0.9 % IV BOLUS
1000.0000 mL | Freq: Once | INTRAVENOUS | Status: AC
  Administered 2019-01-09: 1000 mL via INTRAVENOUS

## 2019-01-08 NOTE — ED Provider Notes (Signed)
[facility]-EMERGENCY DEPT Provider Note   CSN: [id number] Arrival date & time: 01/08/19  1811    History   Chief Complaint Chief Complaint  Patient presents with  . Pneumonia    HPI Susan Barrett is a 43 y.o. female.     HPI Pt presents to the ED for evaluation of pneumonia.  Pt states she has been having cough and congestion for a couple of weeks.  SHe has been feverish and has felt poorly. She saw an outpatient clinic and was diagnosed with PNA.  She was started on augmentin.  She has not felt any better and was started on a z pack.  Pt states she was tested for flu and it was negative.  She still has general malaise.  She went back an urgent care today and was told she has bilateral pna and needed to come to the ED.  Pt denies any vomiting or diarrhea.  No chronic lung problems.  SHe does not smoke.  No recent trips or travel. Past Medical History:  Diagnosis Date  . Anxiety and depression   . Arthritis   . Heart palpitations   . Hyperlipemia   . Hypertension   . Pneumonia   . Sleep apnea     There are no active problems to display for this patient.   Past Surgical History:  Procedure Laterality Date  . C-sections     3  . CHOLECYSTECTOMY       OB History   No obstetric history on file.      Home Medications    Prior to Admission medications   Medication Sig Start Date End Date Taking? Authorizing Provider  azithromycin (ZITHROMAX) 250 MG tablet Take 250-500 mg by mouth daily. Take 2 tablets (500 mg) on Day 1 then Take 1 tablet (250 mg) daily for 4 Days 01/04/19  Yes [provider]  benzonatate (TESSALON) 200 MG capsule Take 200 mg by mouth 3 (three) times daily as needed for cough.  01/01/19  Yes [provider]  HYDROcodone-acetaminophen (NORCO/VICODIN) 5-325 MG tablet Take 1 tablet by mouth every 6 (six) hours as needed for moderate pain or severe pain.   Yes [provider]  ibuprofen (ADVIL,MOTRIN) 200 MG  tablet Take 200 mg by mouth every 6 (six) hours as needed for fever.   Yes [provider]  sertraline (ZOLOFT) 100 MG tablet Take 150 mg by mouth daily.  12/12/18  Yes [provider]  albuterol (PROVENTIL HFA;VENTOLIN HFA) 108 (90 Base) MCG/ACT inhaler Inhale 1-2 puffs into the lungs every 6 (six) hours as needed for wheezing or shortness of breath. 01/09/19   Linwood Dibbles, MD  ibuprofen (ADVIL,MOTRIN) 800 MG tablet Take 1 tablet (800 mg total) by mouth every 8 (eight) hours as needed for pain. Patient not taking: Reported on 01/08/2019 02/20/13   Charm Rings, MD  levofloxacin (LEVAQUIN) 750 MG tablet Take 1 tablet (750 mg total) by mouth daily for 7 days. 01/09/19 01/16/19  Linwood Dibbles, MD    Family History Family History  Problem Relation Age of Onset  . Colon polyps Father   . Crohn's disease Father   . Heart disease Father   . Colon cancer Paternal Aunt   . Colon cancer Paternal Uncle   . Diabetes Son     Social History Social History   Tobacco Use  . Smoking status: Former Games developer  . Smokeless tobacco: Never Used  Substance Use Topics  . Alcohol use: Yes  Alcohol/week: 1.0 standard drinks    Types: 1 Glasses of wine per week    Comment: daily  . Drug use: No     Allergies   Codeine and Hydrocodone   Review of Systems Review of Systems  All other systems reviewed and are negative.    Physical Exam Updated Vital Signs BP 115/79 (BP Location: Right Arm)   Pulse 73   Temp 98.5 F (36.9 C) (Oral)   Resp 16   Ht 1.727 m (5\' 8" )   Wt 117 kg   SpO2 94%   BMI 39.23 kg/m   Physical Exam Vitals signs and nursing note reviewed.  Constitutional:      General: She is not in acute distress.    Appearance: She is well-developed.  HENT:     Head: Normocephalic and atraumatic.     Right Ear: External ear normal.     Left Ear: External ear normal.  Eyes:     General: No scleral icterus.       Right eye: No discharge.        Left eye: No discharge.       Conjunctiva/sclera: Conjunctivae normal.  Neck:     Musculoskeletal: Neck supple.     Trachea: No tracheal deviation.  Cardiovascular:     Rate and Rhythm: Normal rate and regular rhythm.  Pulmonary:     Effort: Pulmonary effort is normal. No respiratory distress.     Breath sounds: No stridor. Wheezing present. No rales.  Abdominal:     General: Bowel sounds are normal. There is no distension.     Palpations: Abdomen is soft.     Tenderness: There is no abdominal tenderness. There is no guarding or rebound.  Musculoskeletal:        General: No tenderness.  Skin:    General: Skin is warm and dry.     Findings: No rash.  Neurological:     Mental Status: She is alert.     Cranial Nerves: No cranial nerve deficit (no facial droop, extraocular movements intact, no slurred speech).     Sensory: No sensory deficit.     Motor: No abnormal muscle tone or seizure activity.     Coordination: Coordination normal.      ED Treatments / Results  Labs (all labs ordered are listed, but only abnormal results are displayed) Labs Reviewed  CBC - Abnormal; Notable for the following components:      Result Value   RBC 5.30 (*)    All other components within normal limits  BASIC METABOLIC PANEL - Abnormal; Notable for the following components:   Sodium 134 (*)    Glucose, Bld 117 (*)    All other components within normal limits     Radiology Dg Chest 2 View  Result Date: 01/08/2019 CLINICAL DATA:  43 year old female started on antibiotics for pneumonia 4 days ago. No improvement. EXAM: CHEST - 2 VIEW COMPARISON:  CT Abdomen and Pelvis 02/20/2013. FINDINGS: Semi upright AP and lateral views of the chest. Multifocal streaky opacity in the left lung appears to be in the lingula based on the lateral view. No pleural effusion. No definite right lung involvement. No pneumothorax or pulmonary edema. Normal cardiac size and mediastinal contours. Visualized tracheal air column is within normal  limits. No acute osseous abnormality identified. Stable cholecystectomy clips. Negative visible bowel gas pattern. IMPRESSION: Multifocal lingula opacity compatible with bronchopneumonia/pneumonia. No pleural effusion. Followup PA and lateral chest X-ray is recommended in 3-4 weeks following  trial of antibiotic therapy to ensure resolution and exclude underlying malignancy. Electronically Signed   By: Odessa Fleming M.D.   On: 01/08/2019 23:05    Procedures Procedures (including critical care time)  Medications Ordered in ED Medications  sodium chloride 0.9 % bolus 1,000 mL (has no administration in time range)  levofloxacin (LEVAQUIN) IVPB 750 mg (has no administration in time range)  ipratropium-albuterol (DUONEB) 0.5-2.5 (3) MG/3ML nebulizer solution 3 mL (3 mLs Nebulization Given 01/09/19 0011)     Initial Impression / Assessment and Plan / ED Course  I have reviewed the triage vital signs and the nursing notes.  Pertinent labs & imaging results that were available during my care of the patient were reviewed by me and considered in my medical decision making (see chart for details).   Patient presented to the emergency room for evaluation of possible pneumonia.  Patient has already been on Augmentin and azithromycin.  X-ray today does show a lingular pneumonia.  Patient's laboratory tests are otherwise normal.  She does not appear dehydrated.  She is oxygenating well.  Patient is nontoxic  Final Clinical Impressions(s) / ED Diagnoses   Final diagnoses:  Community acquired pneumonia of left lung, unspecified part of lung    ED Discharge Orders         Ordered    levofloxacin (LEVAQUIN) 750 MG tablet  Daily     01/09/19 0005    albuterol (PROVENTIL HFA;VENTOLIN HFA) 108 (90 Base) MCG/ACT inhaler  Every 6 hours PRN     01/09/19 0005           Linwood Dibbles, MD 01/09/19 0023

## 2019-01-08 NOTE — ED Triage Notes (Signed)
Patient reports that she was diagnosed with pneumonia and started on oral antibiotics  X 4 days ago. Patient went back to her PCP today because she felt like she was not getting any better. Patient was told she had bilateral pneumonia and to come to the ED for IV antibiotics.

## 2019-01-09 MED ORDER — LEVOFLOXACIN 750 MG PO TABS: 750.0000 mg | ORAL_TABLET | Freq: Every day | ORAL | 0 refills | Status: AC

## 2019-01-09 MED ORDER — ALBUTEROL SULFATE HFA 108 (90 BASE) MCG/ACT IN AERS: 1.0000 | INHALATION_SPRAY | Freq: Four times a day (QID) | RESPIRATORY_TRACT | 0 refills | Status: AC | PRN

## 2019-01-09 NOTE — Discharge Instructions (Signed)
Take the antibiotics as prescribed, follow-up with your primary care doctor next week to make sure you are improving, drink plenty of fluids and rest

## 2020-02-07 ENCOUNTER — Encounter: Payer: Self-pay | Admitting: Neurology

## 2020-02-10 ENCOUNTER — Other Ambulatory Visit: Payer: Self-pay

## 2020-02-10 DIAGNOSIS — R202 Paresthesia of skin: Secondary | ICD-10-CM

## 2020-02-14 ENCOUNTER — Ambulatory Visit: Payer: Self-pay | Admitting: Neurology

## 2020-02-14 ENCOUNTER — Other Ambulatory Visit: Payer: Self-pay

## 2020-02-25 ENCOUNTER — Encounter: Payer: BLUE CROSS/BLUE SHIELD | Admitting: Neurology

## 2021-05-16 ENCOUNTER — Encounter (HOSPITAL_COMMUNITY): Payer: Self-pay | Admitting: Emergency Medicine

## 2021-05-16 ENCOUNTER — Other Ambulatory Visit: Payer: Self-pay

## 2021-05-16 ENCOUNTER — Emergency Department (HOSPITAL_COMMUNITY): Payer: Self-pay

## 2021-05-16 ENCOUNTER — Emergency Department (HOSPITAL_COMMUNITY)
Admission: EM | Admit: 2021-05-16 | Discharge: 2021-05-16 | Disposition: A | Discharge status: Home / Self Care | Payer: Self-pay | Attending: Emergency Medicine | Admitting: Emergency Medicine

## 2021-05-16 DIAGNOSIS — R079 Chest pain, unspecified: Secondary | ICD-10-CM | POA: Insufficient documentation

## 2021-05-16 DIAGNOSIS — R072 Precordial pain: Secondary | ICD-10-CM

## 2021-05-16 DIAGNOSIS — M545 Low back pain, unspecified: Secondary | ICD-10-CM | POA: Insufficient documentation

## 2021-05-16 DIAGNOSIS — I10 Essential (primary) hypertension: Secondary | ICD-10-CM | POA: Insufficient documentation

## 2021-05-16 DIAGNOSIS — Z87891 Personal history of nicotine dependence: Secondary | ICD-10-CM | POA: Insufficient documentation

## 2021-05-16 DIAGNOSIS — S39012A Strain of muscle, fascia and tendon of lower back, initial encounter: Secondary | ICD-10-CM

## 2021-05-16 LAB — CBC
HCT: 44 % (ref 36.0–46.0)
Hemoglobin: 14.8 g/dL (ref 12.0–15.0)
MCH: 29.1 pg (ref 26.0–34.0)
MCHC: 33.6 g/dL (ref 30.0–36.0)
MCV: 86.6 fL (ref 80.0–100.0)
Platelets: 357 10*3/uL (ref 150–400)
RBC: 5.08 MIL/uL (ref 3.87–5.11)
RDW: 12 % (ref 11.5–15.5)
WBC: 7.2 10*3/uL (ref 4.0–10.5)
nRBC: 0 % (ref 0.0–0.2)

## 2021-05-16 LAB — BASIC METABOLIC PANEL
Anion gap: 7 (ref 5–15)
BUN: 11 mg/dL (ref 6–20)
CO2: 23 mmol/L (ref 22–32)
Calcium: 9.3 mg/dL (ref 8.9–10.3)
Chloride: 107 mmol/L (ref 98–111)
Creatinine, Ser: 0.62 mg/dL (ref 0.44–1.00)
GFR, Estimated: >60 mL/min (ref >60)
Glucose, Bld: 103 mg/dL — ABNORMAL HIGH (ref 70–99)
Potassium: 4.2 mmol/L (ref 3.5–5.1)
Sodium: 137 mmol/L (ref 135–145)

## 2021-05-16 LAB — TROPONIN I (HIGH SENSITIVITY)
Troponin I (High Sensitivity): 2 ng/L (ref <18)
Troponin I (High Sensitivity): 3 ng/L (ref <18)

## 2021-05-16 LAB — I-STAT BETA HCG BLOOD, ED (MC, WL, AP ONLY): I-stat hCG, quantitative: <5 m[IU]/mL (ref <5)

## 2021-05-16 MED ORDER — METHOCARBAMOL 750 MG PO TABS: 750.0000 mg | ORAL_TABLET | Freq: Three times a day (TID) | ORAL | 0 refills | Status: AC | PRN

## 2021-05-16 MED ORDER — IBUPROFEN 600 MG PO TABS: 600.0000 mg | ORAL_TABLET | Freq: Three times a day (TID) | ORAL | 0 refills | Status: AC | PRN

## 2021-05-16 MED ORDER — METHOCARBAMOL 500 MG PO TABS
750.0000 mg | ORAL_TABLET | Freq: Once | ORAL | Status: AC
  Administered 2021-05-16: 750 mg via ORAL
  Filled 2021-05-16: qty 2

## 2021-05-16 MED ORDER — IBUPROFEN 400 MG PO TABS
400.0000 mg | ORAL_TABLET | Freq: Once | ORAL | Status: AC
  Administered 2021-05-16: 400 mg via ORAL
  Filled 2021-05-16: qty 1

## 2021-05-16 NOTE — ED Triage Notes (Signed)
Patient BIB [facility] from home. Complaint of chest pain that started 30 min ago, and back pain x3 days. Pt received 324 aspirin, 10 mcg fentanyl via EMS, 18G AC

## 2021-05-16 NOTE — Discharge Instructions (Addendum)
It was our pleasure to provide your ER care today - we hope that you feel better.  Avoid bending at  waist, or heavy lifting > 20 lbs for the next week. You may try gentle massage, heat  therapy, and gentle stretching for symptom relief.   Take ibuprofen or aleve as need for pain. You may also take robaxin as need for muscle pain/spasm - no driving when taking.  Follow up with primary care doctor in the next 1-2 weeks.   For chest discomfort, ER tests are good/reassuring - given family hx, and recent chest pain, follow up with cardiologist in the next 1-2 weeks - call office for appointment.   Return to ER if worse, new symptoms, fevers, severe/intractable back pain, numbness/weakness, recurrent or persistent chest pain, trouble breathing, or other concern.

## 2021-05-16 NOTE — ED Provider Notes (Signed)
[facility] EMERGENCY DEPARTMENT Provider Note   CSN: [id number] Arrival date & time: 05/16/21  1257     History Chief Complaint  Patient presents with   Chest Pain    Susan Barrett is a 45 y.o. female.  Patient c/o chest pain and low back pain. States had mechanical fall this past week, and increased lower back pain since - dull, moderate, non radiating, worse w certain movements and position changes. Indicates low  back pain was hurting so much feels that made her chest hurt in the past day - at rest, constant, dull, moderate, non radiating. No associated sob, nv or diaphoresis. No pleuritic pain. Came by EMS, and states after they have her pain meds no more chest pain, but low back pain persists. Denies personal hx cad, or fam hx premature cad. No leg pain or swelling. No hx dvt or pe. No heartburn. W back pain, denies radicular pain, no saddle area or leg numbness, no weakness, no problems w normal bowel or bladder function.   The history is provided by the patient and the EMS personnel.  Chest Pain Associated symptoms: back pain   Associated symptoms: no abdominal pain, no cough, no fever, no headache, no palpitations, no shortness of breath and no vomiting       Past Medical History:  Diagnosis Date   Anxiety and depression    Arthritis    Heart palpitations    Hyperlipemia    Hypertension    Pneumonia    Sleep apnea     There are no problems to display for this patient.   Past Surgical History:  Procedure Laterality Date   C-sections     3   CHOLECYSTECTOMY       OB History   No obstetric history on file.     Family History  Problem Relation Age of Onset   Colon polyps Father    Crohn's disease Father    Heart disease Father    Colon cancer Paternal Aunt    Colon cancer Paternal Uncle    Diabetes Son     Social History   Tobacco Use   Smoking status: Former   Smokeless tobacco: Never  Building services engineer Use: Never used   Substance Use Topics   Alcohol use: Yes    Alcohol/week: 1.0 standard drink    Types: 1 Glasses of wine per week    Comment: daily   Drug use: No    Home Medications Prior to Admission medications   Medication Sig Start Date End Date Taking? Authorizing Provider  albuterol (PROVENTIL HFA;VENTOLIN HFA) 108 (90 Base) MCG/ACT inhaler Inhale 1-2 puffs into the lungs every 6 (six) hours as needed for wheezing or shortness of breath. 01/09/19   Linwood Dibbles, MD  azithromycin (ZITHROMAX) 250 MG tablet Take 250-500 mg by mouth daily. Take 2 tablets (500 mg) on Day 1 then Take 1 tablet (250 mg) daily for 4 Days 01/04/19   [provider]  benzonatate (TESSALON) 200 MG capsule Take 200 mg by mouth 3 (three) times daily as needed for cough.  01/01/19   [provider]  HYDROcodone-acetaminophen (NORCO/VICODIN) 5-325 MG tablet Take 1 tablet by mouth every 6 (six) hours as needed for moderate pain or severe pain.    [provider]  ibuprofen (ADVIL,MOTRIN) 200 MG tablet Take 200 mg by mouth every 6 (six) hours as needed for fever.    [provider]  ibuprofen (ADVIL,MOTRIN) 800 MG  tablet Take 1 tablet (800 mg total) by mouth every 8 (eight) hours as needed for pain. Patient not taking: Reported on 01/08/2019 02/20/13   Charm Rings, MD  sertraline (ZOLOFT) 100 MG tablet Take 150 mg by mouth daily.  12/12/18   [provider]    Allergies    Codeine and Hydrocodone  Review of Systems   Review of Systems  Constitutional:  Negative for chills and fever.  HENT:  Negative for sore throat.   Eyes:  Negative for redness.  Respiratory:  Negative for cough and shortness of breath.   Cardiovascular:  Positive for chest pain. Negative for palpitations and leg swelling.  Gastrointestinal:  Negative for abdominal pain, diarrhea and vomiting.  Genitourinary:  Negative for flank pain.  Musculoskeletal:  Positive for back pain. Negative for neck pain.  Skin:  Negative for  rash.  Neurological:  Negative for headaches.  Hematological:  Does not bruise/bleed easily.  Psychiatric/Behavioral:  Negative for confusion.    Physical Exam Updated Vital Signs BP 99/69 (BP Location: Right Arm)   Pulse 68   Temp 98.9 F (37.2 C) (Oral)   Resp 17   SpO2 100%   Physical Exam Vitals and nursing note reviewed.  Constitutional:      Appearance: Normal appearance. She is well-developed.  HENT:     Head: Atraumatic.     Nose: Nose normal.     Mouth/Throat:     Mouth: Mucous membranes are moist.  Eyes:     General: No scleral icterus.    Conjunctiva/sclera: Conjunctivae normal.     Pupils: Pupils are equal, round, and reactive to light.  Neck:     Trachea: No tracheal deviation.  Cardiovascular:     Rate and Rhythm: Normal rate and regular rhythm.     Pulses: Normal pulses.     Heart sounds: Normal heart sounds. No murmur heard.   No friction rub. No gallop.  Pulmonary:     Effort: Pulmonary effort is normal. No respiratory distress.     Breath sounds: Normal breath sounds.  Chest:     Chest wall: No tenderness.  Abdominal:     General: There is no distension.     Palpations: Abdomen is soft.     Tenderness: There is no abdominal tenderness.  Genitourinary:    Comments: No cva tenderness.  Musculoskeletal:        General: No swelling or tenderness.     Cervical back: Normal range of motion and neck supple. No rigidity. No muscular tenderness.     Right lower leg: No edema.     Left lower leg: No edema.     Comments: Lumbar tenderness, otherwise, CTLS spine, non tender, aligned, no step off.   Skin:    General: Skin is warm and dry.     Findings: No rash.  Neurological:     Mental Status: She is alert.     Comments: Alert, speech normal. Bilateral legs, motor/sens intact. Reflexes symmetric/normal. Steady gait.   Psychiatric:        Mood and Affect: Mood normal.    ED Results / Procedures / Treatments   Labs (all labs ordered are listed, but  only abnormal results are displayed) Results for orders placed or performed during the hospital encounter of 05/16/21  Basic metabolic panel  Result Value Ref Range   Sodium 137 135 - 145 mmol/L   Potassium 4.2 3.5 - 5.1 mmol/L   Chloride 107 98 - 111 mmol/L  CO2 23 22 - 32 mmol/L   Glucose, Bld 103 (H) 70 - 99 mg/dL   BUN 11 6 - 20 mg/dL   Creatinine, Ser 4.090.62 0.44 - 1.00 mg/dL   Calcium 9.3 8.9 - 81.110.3 mg/dL   GFR, Estimated >91>60 >47>60 mL/min   Anion gap 7 5 - 15  CBC  Result Value Ref Range   WBC 7.2 4.0 - 10.5 K/uL   RBC 5.08 3.87 - 5.11 MIL/uL   Hemoglobin 14.8 12.0 - 15.0 g/dL   HCT 82.944.0 56.236.0 - 13.046.0 %   MCV 86.6 80.0 - 100.0 fL   MCH 29.1 26.0 - 34.0 pg   MCHC 33.6 30.0 - 36.0 g/dL   RDW 86.512.0 78.411.5 - 69.615.5 %   Platelets 357 150 - 400 K/uL   nRBC 0.0 0.0 - 0.2 %  I-Stat beta hCG blood, ED  Result Value Ref Range   I-stat hCG, quantitative <5.0 <5 mIU/mL   Comment 3          Troponin I (High Sensitivity)  Result Value Ref Range   Troponin I (High Sensitivity) 2 <18 ng/L  Troponin I (High Sensitivity)  Result Value Ref Range   Troponin I (High Sensitivity) 3 <18 ng/L   DG Chest 2 View  Result Date: 05/16/2021 CLINICAL DATA:  Chest pain EXAM: CHEST - 2 VIEW COMPARISON:  None. FINDINGS: Normal mediastinum and cardiac silhouette. Normal pulmonary vasculature. No evidence of effusion, infiltrate, or pneumothorax. No acute bony abnormality. IMPRESSION: No acute cardiopulmonary process. Electronically Signed   By: Genevive BiStewart  Edmunds M.D.   On: 05/16/2021 13:25     EKG EKG Interpretation  Date/Time:  Sunday May 16 2021 12:55:46 EDT Ventricular Rate:  62 PR Interval:  160 QRS Duration: 74 QT Interval:  422 QTC Calculation: 428 R Axis:   -6 Text Interpretation: Normal sinus rhythm Nonspecific T wave abnormality Confirmed by Cathren LaineSteinl, Kinsley Nicklaus ([id number]) on 05/16/2021 3:03:42 PM  Radiology DG Chest 2 View  Result Date: 05/16/2021 CLINICAL DATA:  Chest pain EXAM: CHEST - 2 VIEW  COMPARISON:  None. FINDINGS: Normal mediastinum and cardiac silhouette. Normal pulmonary vasculature. No evidence of effusion, infiltrate, or pneumothorax. No acute bony abnormality. IMPRESSION: No acute cardiopulmonary process. Electronically Signed   By: Genevive BiStewart  Edmunds M.D.   On: 05/16/2021 13:25   DG Lumbar Spine Complete  Result Date: 05/16/2021 CLINICAL DATA:  Pain post fall. EXAM: LUMBAR SPINE - COMPLETE 4+ VIEW COMPARISON:  None. FINDINGS: There is no evidence of lumbar spine fracture. Alignment is normal. Intervertebral disc spaces are maintained. IMPRESSION: Negative. Electronically Signed   By: Ted Mcalpineobrinka  Dimitrova M.D.   On: 05/16/2021 16:42    Procedures Procedures   Medications Ordered in ED Medications  ibuprofen (ADVIL) tablet 400 mg (has no administration in time range)  methocarbamol (ROBAXIN) tablet 750 mg (has no administration in time range)    ED Course  I have reviewed the triage vital signs and the nursing notes.  Pertinent labs & imaging results that were available during my care of the patient were reviewed by me and considered in my medical decision making (see chart for details).    MDM Rules/Calculators/A&P                         Continuous pulse ox and cardiac monitoring. Stat labs. Imaging.   Reviewed nursing notes and prior charts for additional history.   Labs reviewed/interpreted by me - trop normal.  Xrays reviewed/interpreted by me -  no fx.   Robaxin po, ibuprofen po.   Additional labs reviewed/interpreted by me - initial and delta trop normal and not increasing - felt not c/w acs.   Recheck pt, no cp or sob.     Final Clinical Impression(s) / ED Diagnoses Final diagnoses:  None    Rx / DC Orders ED Discharge Orders     None        Cathren Laine, MD 05/16/21 [phone]

## 2021-05-18 ENCOUNTER — Ambulatory Visit: Payer: Self-pay | Admitting: Cardiovascular Disease

## 2023-01-05 IMAGING — DX DG LUMBAR SPINE COMPLETE 4+V
5 series · 5 of 5 positions shown · non-contrast
Comparison: None.

CLINICAL DATA: Pain post fall.

EXAM:
LUMBAR SPINE - COMPLETE 4+ VIEW

[l-spine ap]
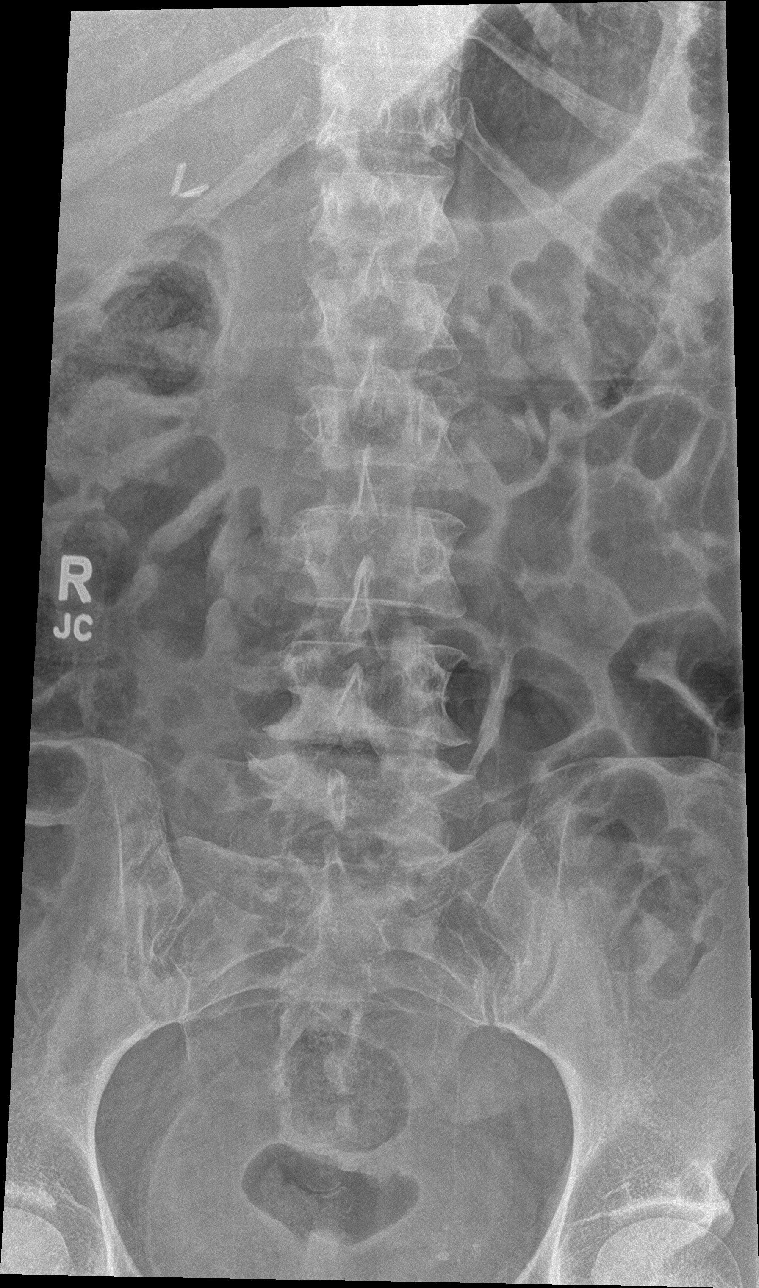

[l-spine obl (1 of 2)]
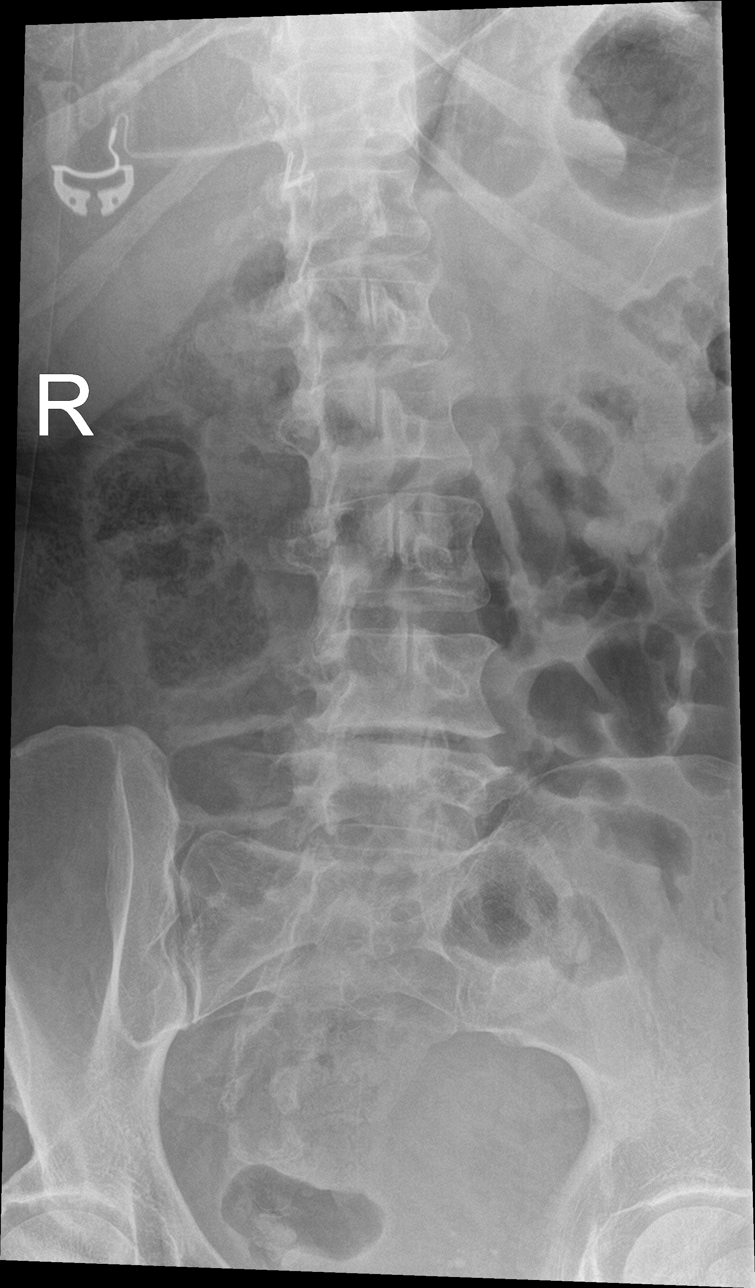

[l-spine obl (2 of 2)]
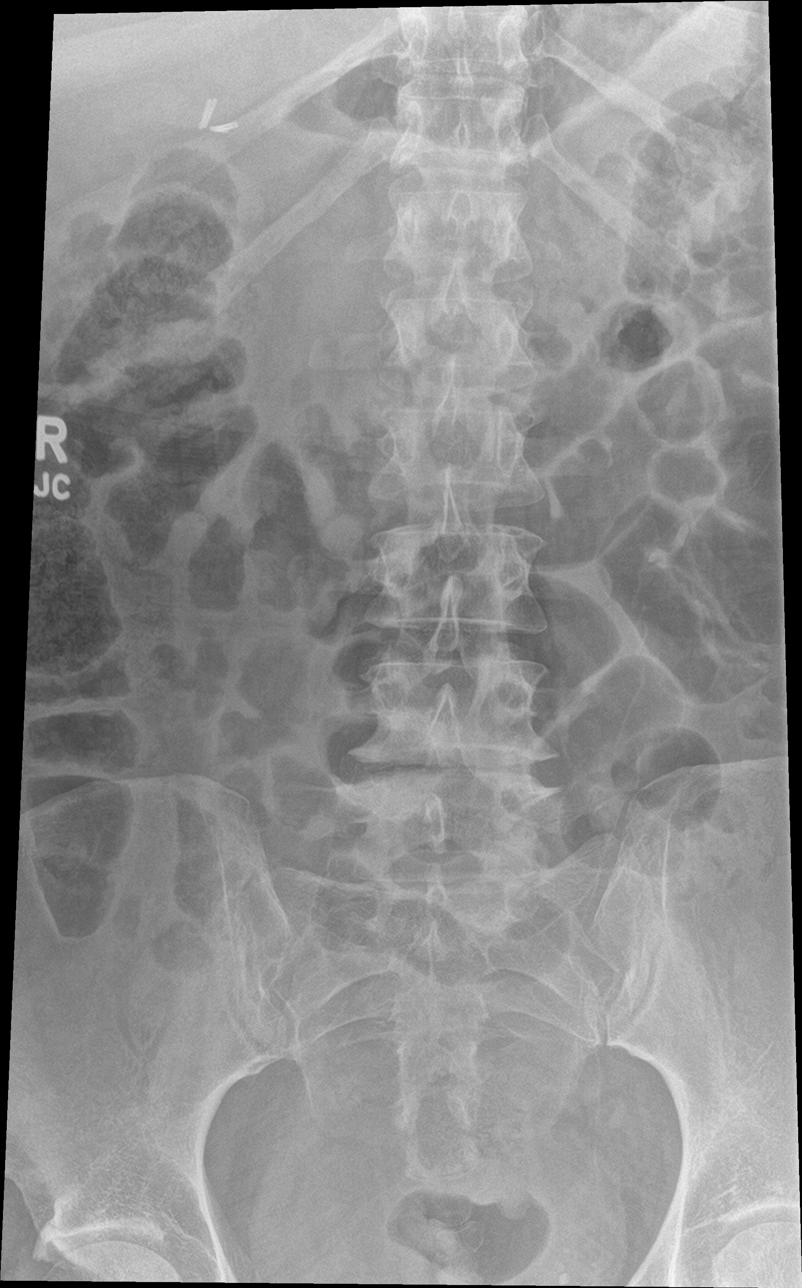

[l-spine lat]
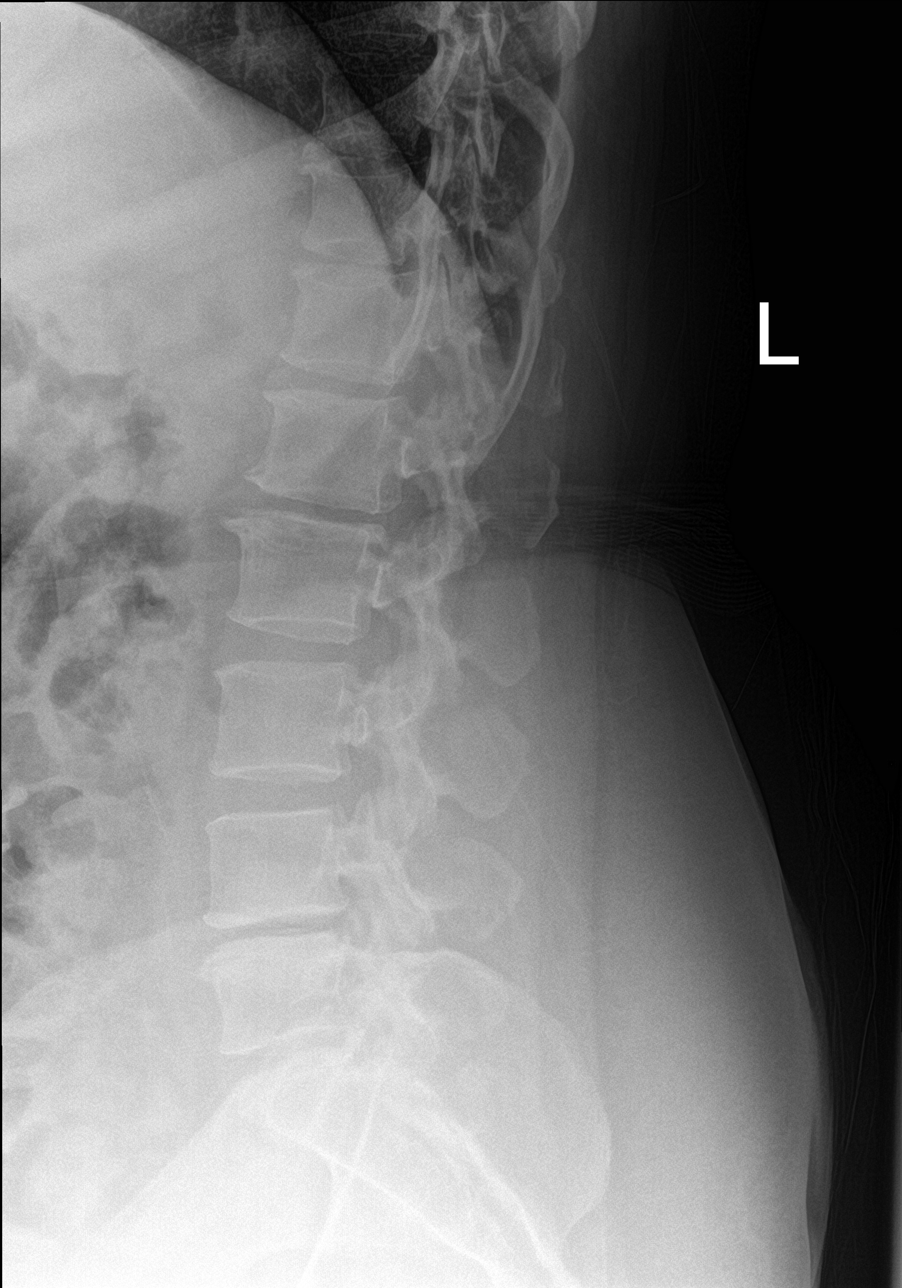

[l-spine spot]
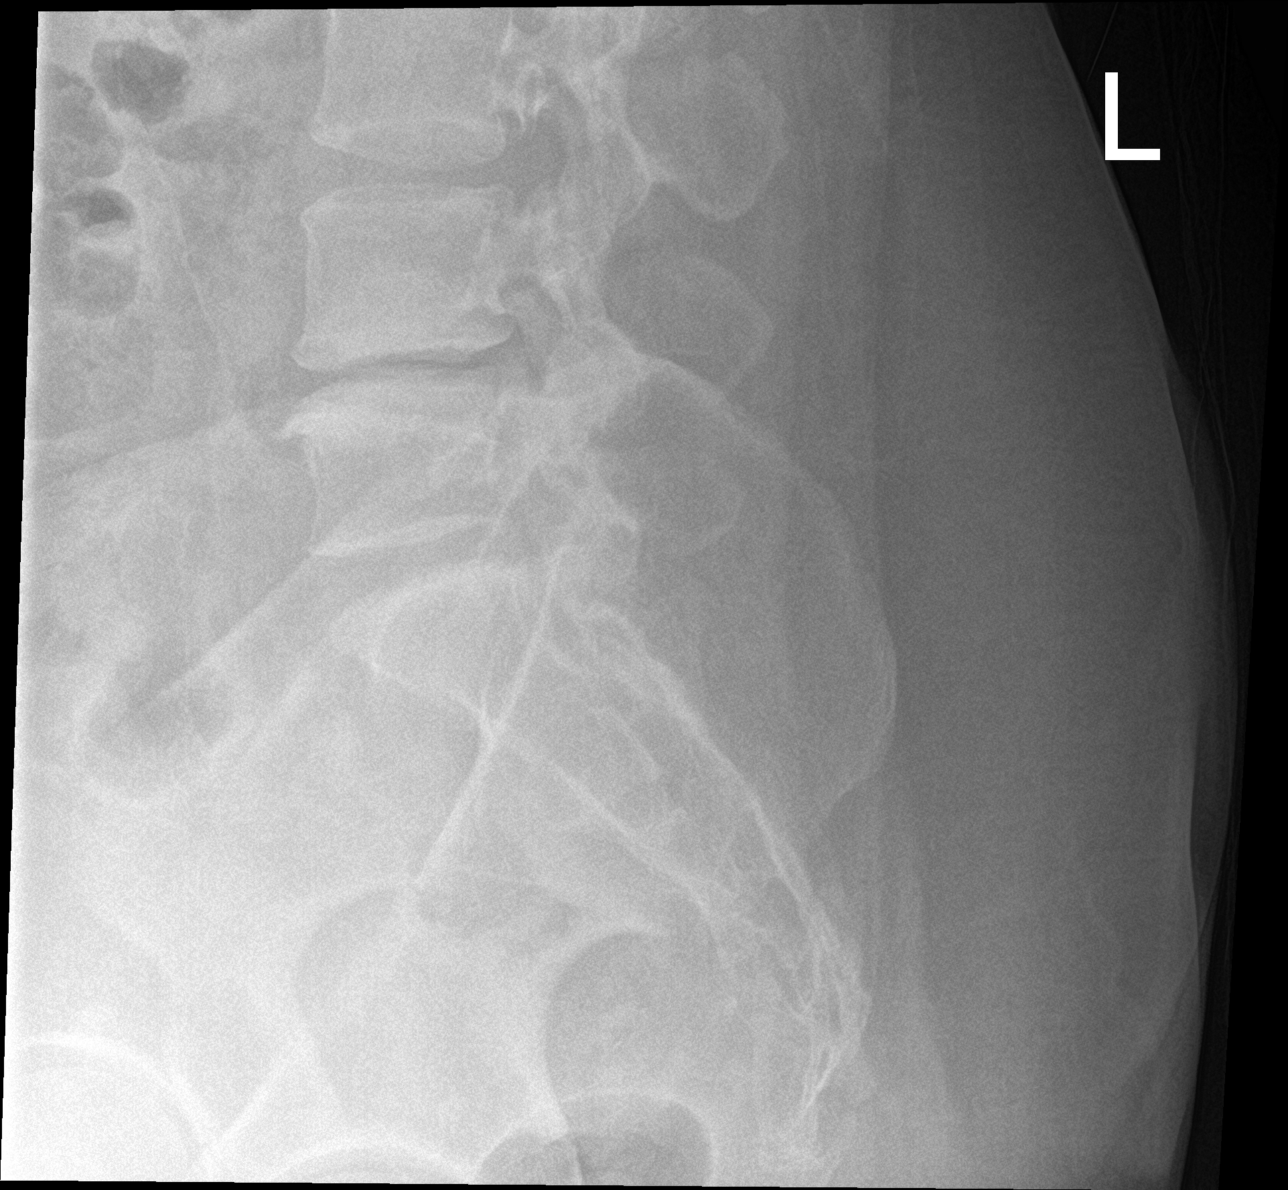

[5 of 5 positions shown; findings below may reference images not displayed]

FINDINGS: There is no evidence of lumbar spine fracture. Alignment is normal.
Intervertebral disc spaces are maintained.
IMPRESSION: Negative.
